# Patient Record
Sex: Male | Born: 1960 | Race: White | Hispanic: Yes | Marital: Married
Health system: Southern US, Community
[De-identification: ages and names within clinical notes are randomized; demographics above are authoritative.]

## PROBLEM LIST (undated history)

## (undated) DIAGNOSIS — J449 Chronic obstructive pulmonary disease, unspecified: Secondary | ICD-10-CM

---

## 2019-07-15 ENCOUNTER — Other Ambulatory Visit: Payer: Self-pay

## 2019-07-15 ENCOUNTER — Emergency Department: Payer: BC Managed Care – PPO

## 2019-07-15 ENCOUNTER — Encounter: Payer: Self-pay | Admitting: Psychiatry

## 2019-07-15 ENCOUNTER — Emergency Department
Admission: EM | Admit: 2019-07-15 | Discharge: 2019-07-15 | Disposition: A | Discharge status: Home / Self Care | Payer: BC Managed Care – PPO | Attending: Emergency Medicine | Admitting: Emergency Medicine

## 2019-07-15 DIAGNOSIS — S82142A Displaced bicondylar fracture of left tibia, initial encounter for closed fracture: Secondary | ICD-10-CM | POA: Insufficient documentation

## 2019-07-15 DIAGNOSIS — M25552 Pain in left hip: Secondary | ICD-10-CM | POA: Insufficient documentation

## 2019-07-15 DIAGNOSIS — Y998 Other external cause status: Secondary | ICD-10-CM | POA: Diagnosis not present

## 2019-07-15 DIAGNOSIS — Y92410 Unspecified street and highway as the place of occurrence of the external cause: Secondary | ICD-10-CM | POA: Diagnosis not present

## 2019-07-15 DIAGNOSIS — Y9355 Activity, bike riding: Secondary | ICD-10-CM | POA: Diagnosis not present

## 2019-07-15 DIAGNOSIS — S8992XA Unspecified injury of left lower leg, initial encounter: Secondary | ICD-10-CM | POA: Diagnosis present

## 2019-07-15 DIAGNOSIS — J449 Chronic obstructive pulmonary disease, unspecified: Secondary | ICD-10-CM | POA: Insufficient documentation

## 2019-07-15 HISTORY — DX: Chronic obstructive pulmonary disease, unspecified: J44.9

## 2019-07-15 MED ORDER — OXYCODONE-ACETAMINOPHEN 5-325 MG PO TABS: 1.0000 | ORAL_TABLET | ORAL | 0 refills | Status: AC | PRN

## 2019-07-15 MED ORDER — MORPHINE SULFATE (PF) 4 MG/ML IV SOLN
4.0000 mg | Freq: Once | INTRAVENOUS | Status: AC
  Administered 2019-07-15: 4 mg via INTRAVENOUS
  Filled 2019-07-15: qty 1
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
## 2019-07-15 NOTE — Discharge Instructions (Signed)
Keep the knee immobilizer on at all times until you follow-up with the orthopedist.  You should not bear weight on the left leg.  Call orthopedics tomorrow to arrange for follow-up within the next several days.  Return to the ER for new, worsening, or persistent severe pain, weakness or numbness, or any other new or worsening symptoms that concern you.
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
